# Patient Record
Sex: Male | Born: 2000 | Hispanic: Yes | Marital: Single | Smoking: Former smoker
Health system: Southern US, Community
[De-identification: ages and names within clinical notes are randomized; demographics above are authoritative.]

---
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
## 2019-06-21 ENCOUNTER — Other Ambulatory Visit: Payer: Self-pay

## 2019-06-21 ENCOUNTER — Emergency Department: Payer: BLUE CROSS/BLUE SHIELD

## 2019-06-21 ENCOUNTER — Emergency Department
Admission: EM | Admit: 2019-06-21 | Discharge: 2019-06-21 | Disposition: A | Discharge status: Home / Self Care | Payer: BLUE CROSS/BLUE SHIELD | Attending: Student | Admitting: Student

## 2019-06-21 DIAGNOSIS — M546 Pain in thoracic spine: Secondary | ICD-10-CM | POA: Insufficient documentation

## 2019-06-21 DIAGNOSIS — R079 Chest pain, unspecified: Secondary | ICD-10-CM

## 2019-06-21 DIAGNOSIS — Z87891 Personal history of nicotine dependence: Secondary | ICD-10-CM | POA: Diagnosis not present

## 2019-06-21 DIAGNOSIS — R0789 Other chest pain: Secondary | ICD-10-CM | POA: Diagnosis present

## 2019-06-21 LAB — CBC WITH DIFFERENTIAL/PLATELET
Abs Immature Granulocytes: 0.02 10*3/uL (ref 0.00–0.07)
Basophils Absolute: 0.1 10*3/uL (ref 0.0–0.1)
Basophils Relative: 1 %
Eosinophils Absolute: 0.1 10*3/uL (ref 0.0–0.5)
Eosinophils Relative: 2 %
HCT: 44.4 % (ref 39.0–52.0)
Hemoglobin: 14.5 g/dL (ref 13.0–17.0)
Immature Granulocytes: 0 %
Lymphocytes Relative: 32 %
Lymphs Abs: 2 10*3/uL (ref 0.7–4.0)
MCH: 29.2 pg (ref 26.0–34.0)
MCHC: 32.7 g/dL (ref 30.0–36.0)
MCV: 89.5 fL (ref 80.0–100.0)
Monocytes Absolute: 0.5 10*3/uL (ref 0.1–1.0)
Monocytes Relative: 8 %
Neutro Abs: 3.5 10*3/uL (ref 1.7–7.7)
Neutrophils Relative %: 57 %
Platelets: 161 10*3/uL (ref 150–400)
RBC: 4.96 MIL/uL (ref 4.22–5.81)
RDW: 12 % (ref 11.5–15.5)
WBC: 6.2 10*3/uL (ref 4.0–10.5)
nRBC: 0 % (ref 0.0–0.2)

## 2019-06-21 LAB — URINE DRUG SCREEN, QUALITATIVE (ARMC ONLY)
Amphetamines, Ur Screen: NOT DETECTED
Barbiturates, Ur Screen: NOT DETECTED
Benzodiazepine, Ur Scrn: NOT DETECTED
Cannabinoid 50 Ng, Ur ~~LOC~~: POSITIVE — AB
Cocaine Metabolite,Ur ~~LOC~~: NOT DETECTED
MDMA (Ecstasy)Ur Screen: NOT DETECTED
Methadone Scn, Ur: NOT DETECTED
Opiate, Ur Screen: NOT DETECTED
Phencyclidine (PCP) Ur S: NOT DETECTED
Tricyclic, Ur Screen: NOT DETECTED

## 2019-06-21 LAB — BASIC METABOLIC PANEL
Anion gap: 11 (ref 5–15)
BUN: 12 mg/dL (ref 6–20)
CO2: 25 mmol/L (ref 22–32)
Calcium: 9.5 mg/dL (ref 8.9–10.3)
Chloride: 104 mmol/L (ref 98–111)
Creatinine, Ser: 0.73 mg/dL (ref 0.61–1.24)
GFR calc Af Amer: >60 mL/min (ref >60)
GFR calc non Af Amer: >60 mL/min (ref >60)
Glucose, Bld: 108 mg/dL — ABNORMAL HIGH (ref 70–99)
Potassium: 3.7 mmol/L (ref 3.5–5.1)
Sodium: 140 mmol/L (ref 135–145)

## 2019-06-21 LAB — TROPONIN I (HIGH SENSITIVITY): Troponin I (High Sensitivity): <2 ng/L (ref <18)

## 2019-06-21 MED ORDER — KETOROLAC TROMETHAMINE 15 MG/ML IJ SOLN
15.0000 mg | Freq: Once | INTRAMUSCULAR | Status: DC
  Filled 2019-06-21: qty 1

## 2019-06-21 MED ORDER — IBUPROFEN 600 MG PO TABS: 600.0000 mg | ORAL_TABLET | Freq: Four times a day (QID) | ORAL | 0 refills | Status: AC | PRN

## 2019-06-21 MED ORDER — ACETAMINOPHEN 500 MG PO TABS
1000.0000 mg | ORAL_TABLET | Freq: Once | ORAL | Status: AC
  Administered 2019-06-21: 10:00:00 1000 mg via ORAL
  Filled 2019-06-21: qty 2

## 2019-06-21 MED ORDER — KETOROLAC TROMETHAMINE 30 MG/ML IJ SOLN
INTRAMUSCULAR | Status: AC
  Administered 2019-06-21: 15 mg
  Filled 2019-06-21: qty 1

## 2019-06-21 NOTE — ED Triage Notes (Signed)
Pt c/o chest pain with HA since last week, went away and then came back yesterday after smoking marijuana a little better today but still having some chest pain. Pt is In NAd at present.

## 2019-06-21 NOTE — ED Provider Notes (Signed)
[facility] Emergency Department Provider Note  ____________________________________________   First MD Initiated Contact with Patient 06/21/19 [id number]     (approximate)  I have reviewed the triage vital signs and the nursing notes.  History  Chief Complaint Chest Pain    HPI Fernando Hughes is a 18 y.o. male otherwise healthy who presents to the emergency department for upper back and chest pain.  Patient states the pain is primarily in his upper back area and radiates to his chest.  He states he had these symptoms several days ago, which resolved spontaneously after a day .  Today, he seemed to notice his symptoms return, though reports on arrival to the emergency department his symptoms are already improving.  He denies any associated nausea, vomiting, diuresis, shortness of breath.  No history of DVT or PE.  He does smoke marijuana.  He denies any other drug use.  No fevers, respiratory symptoms, GI symptoms.  He does do heavy lifting at work.         Past Medical Hx History reviewed. No pertinent past medical history.  Problem List There are no active problems to display for this patient.   Past Surgical Hx History reviewed. No pertinent surgical history.  Medications Prior to Admission medications   Medication Sig Start Date End Date Taking? Authorizing Provider  ibuprofen (ADVIL) 600 MG tablet Take 1 tablet (600 mg total) by mouth every 6 (six) hours as needed for mild pain. 06/21/19   Miguel AschoffMonks,  L., MD    Allergies Patient has no known allergies.  Family Hx No family history on file.  Social Hx Social History   Tobacco Use  . Smoking status: Former Games developermoker  . Smokeless tobacco: Never Used  Substance Use Topics  . Alcohol use: Yes  . Drug use: Yes    Types: Marijuana     Review of Systems  Constitutional: Negative for fever. Negative for chills. Eyes: Negative for visual changes. ENT: Negative for sore throat. Cardiovascular: +  for chest pain. Respiratory: Negative for shortness of breath. Gastrointestinal: Negative for abdominal pain. Negative for nausea. Negative for vomiting. Genitourinary: Negative for dysuria. Musculoskeletal: Negative for leg swelling. + Upper back pain Skin: Negative for rash. Neurological: Negative for for headaches.   Physical Exam  Vital Signs: ED Triage Vitals  Enc Vitals Group     BP 06/21/19 0919 128/85     Pulse Rate 06/21/19 0919 90     Resp 06/21/19 0919 17     Temp 06/21/19 1127 98.4 F (36.9 C)     Temp Source 06/21/19 0919 Oral     SpO2 06/21/19 0919 100 %     Weight 06/21/19 0919 125 lb (56.7 kg)     Height 06/21/19 0919 5\' 10"  (1.778 m)     Head Circumference --      Peak Flow --      Pain Score 06/21/19 0919 2     Pain Loc --      Pain Edu? --      Excl. in [place]? --     Constitutional: Alert and oriented.  Eyes: Conjunctivae clear. Sclera anicteric. Head: Normocephalic. Atraumatic. Nose: No congestion. No rhinorrhea. Mouth/Throat: Mucous membranes are moist.  Neck: No stridor.   Cardiovascular: Normal rate, regular rhythm. No murmurs. Extremities well perfused. Respiratory: Normal respiratory effort.  Lungs CTAB. Gastrointestinal: Soft and non-tender. No distention.  Musculoskeletal: No lower extremity edema.  Mild upper thoracic paraspinal muscle tender to palpation.  No  midline tenderness. Neurologic:  Normal speech and language. No gross focal neurologic deficits are appreciated.  Skin: Skin is warm, dry and intact. No rash noted. Psychiatric: Mood and affect are appropriate for situation.  EKG  Personally reviewed.   Rate: 80s Rhythm: Sinus Axis: Normal Intervals: Within normal limits Sinus rhythm with sinus arrhythmia No acute ischemic changes No STEMI    Radiology  XR: IMPRESSION: No acute abnormality of the lungs.   Procedures  Procedure(s) performed (including critical care):  Procedures   Initial Impression / Assessment  and Plan / ED Course  18 y.o. male who presents to the ED for upper back pain, chest pain, as above  Ddx: musculoskeletal, less likely ACS given age and lack of risk factors.  No risk factors, and no tachycardia, tachypnea, or hypoxia suggestive of PE.  No recent illnesses or EKG findings suggestive of pericarditis.  Plan: labs, x-ray, symptom control and reassess  EKG reveals sinus rhythm with sinus arrhythmia, but no other evidence of ischemia.  High-sensitivity troponin negative, x-ray negative, and remainder of labs without actionable derangements.  Suspect likely musculoskeletal etiology of patient's symptoms.  We will plan for discharge with supportive care, advised PCP follow-up and given return precautions.  Patient is agreeable with the plan and discharge.    Final Clinical Impression(s) / ED Diagnosis  Final diagnoses:  Chest pain in adult       Note:  This document was prepared using Dragon voice recognition software and may include unintentional dictation errors.   Lilia Pro., MD 06/21/19 [id number]

## 2019-06-21 NOTE — Discharge Instructions (Addendum)
Thank you for letting us take care of you in the emergency department today.   Please continue to take any regular, prescribed medications.   New medications we have prescribed:  - ibuprofen, for pain as needed  Please follow up with: - Your primary care doctor to review your ER visit and follow up on your symptoms.   Please return to the ER for any new or worsening symptoms.

## 2020-09-24 IMAGING — CR DG CHEST 2V
1 series · 2 of 2 positions shown · non-contrast
Comparison: None.

CLINICAL DATA: Chest pain, headache

EXAM:
CHEST - 2 VIEW

[Series 1: w chest pa · 0.14mm/px · 2 of 2 slices shown]
[im 1/2]
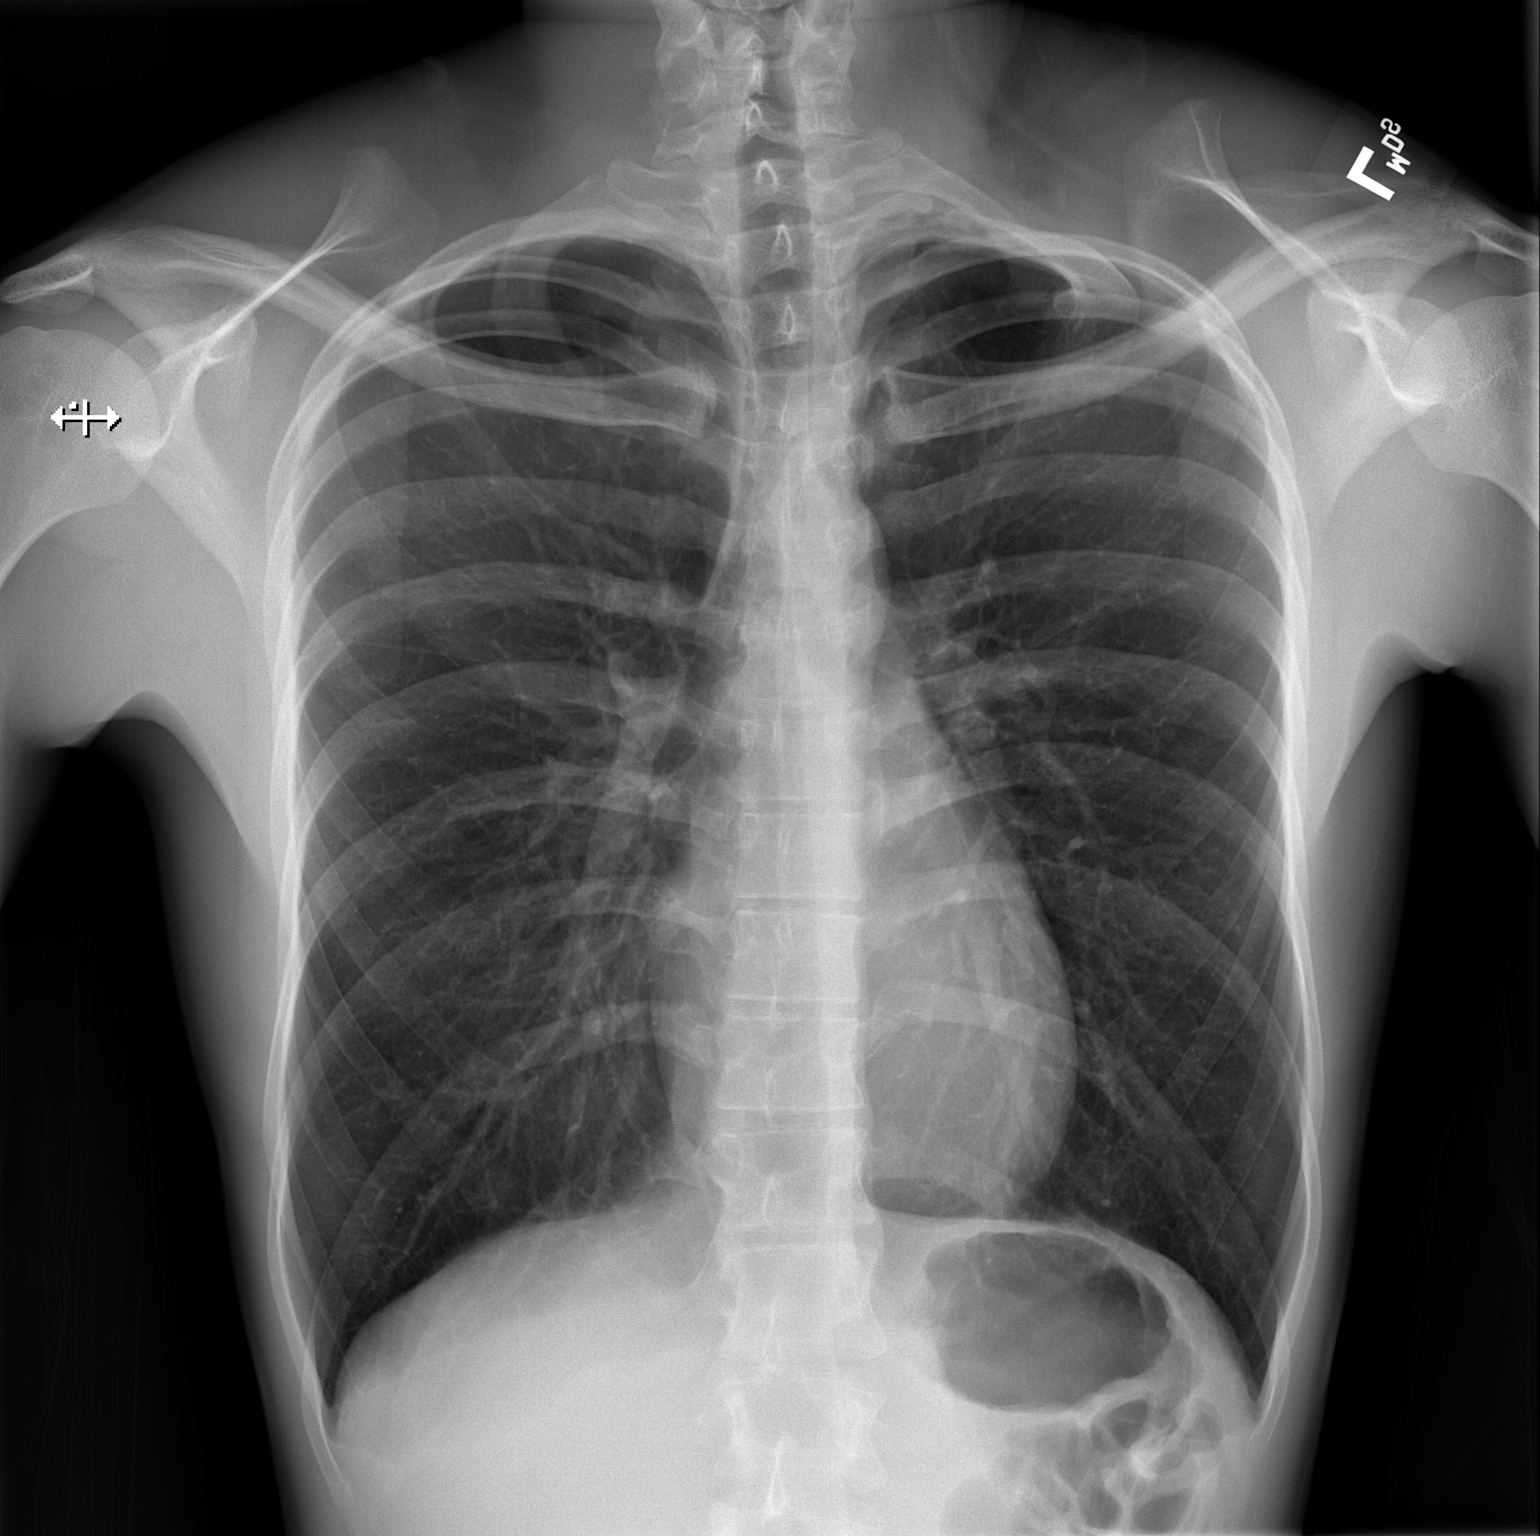
[im 2/2]
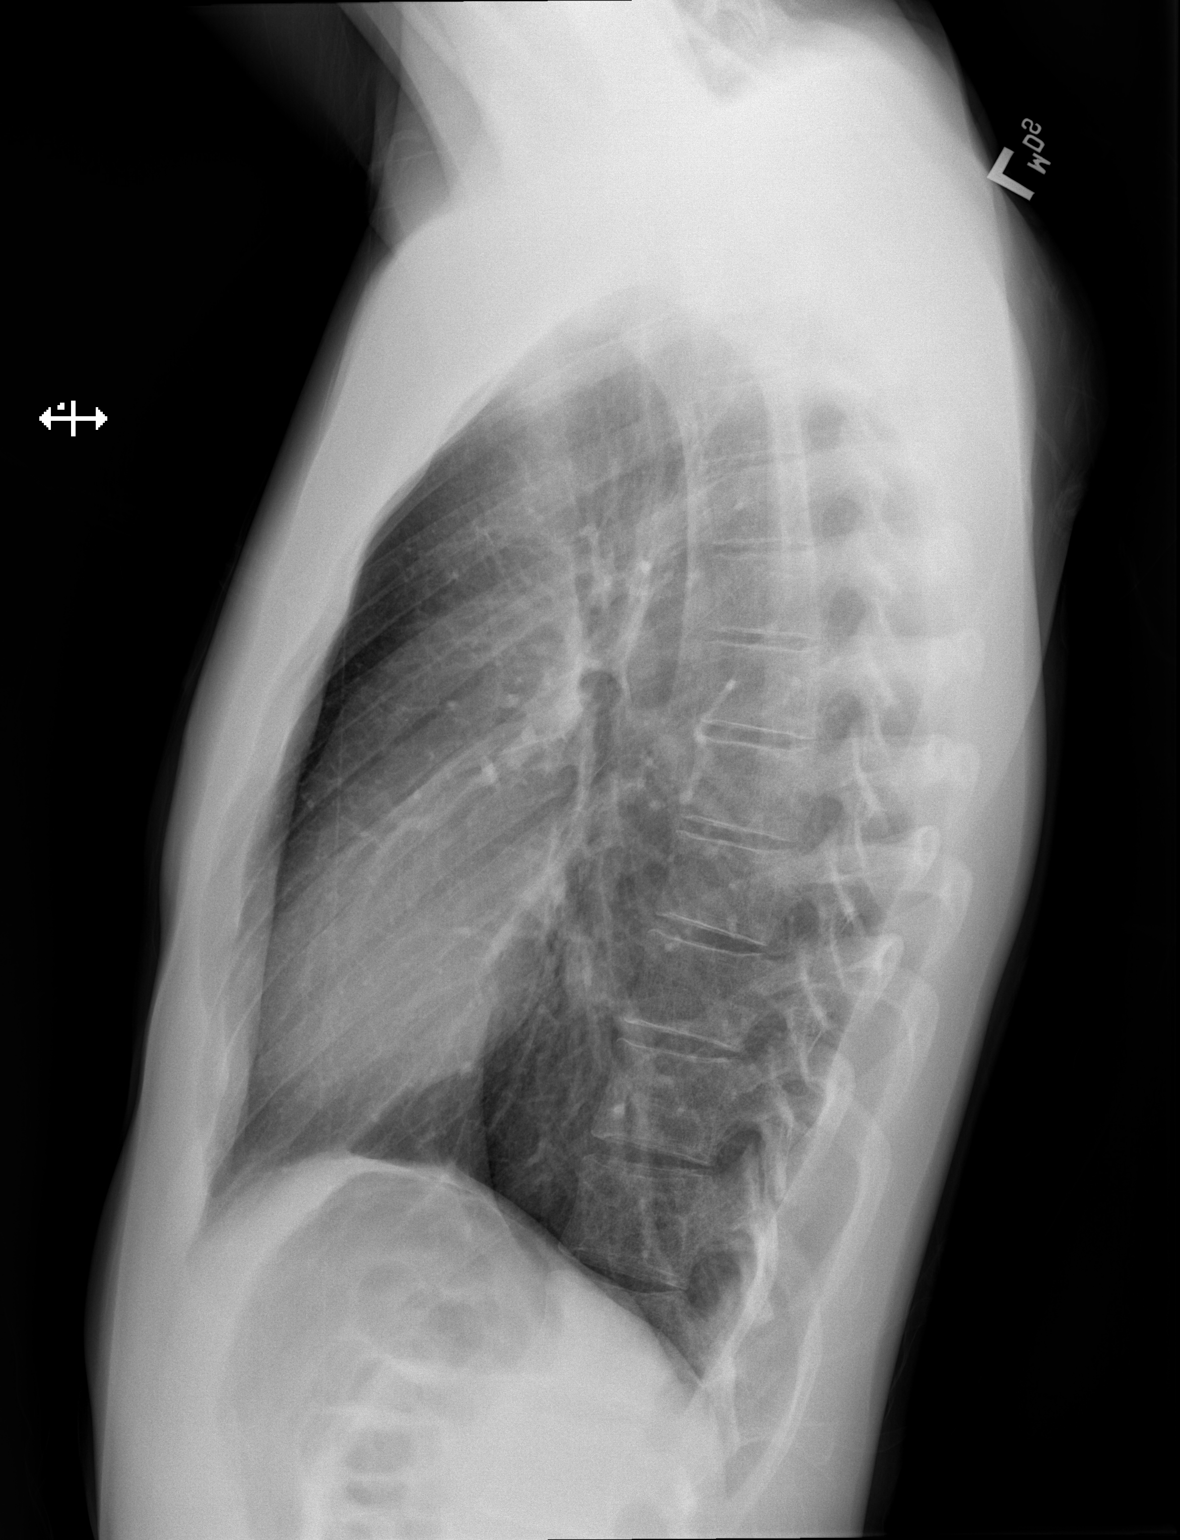

[2 of 2 positions shown; findings below may reference images not displayed]

FINDINGS: The heart size and mediastinal contours are within normal limits.
Both lungs are clear. The visualized skeletal structures are
unremarkable.
IMPRESSION: No acute abnormality of the lungs.

## 2022-12-13 ENCOUNTER — Other Ambulatory Visit: Payer: Self-pay

## 2022-12-13 ENCOUNTER — Emergency Department
Admission: EM | Admit: 2022-12-13 | Discharge: 2022-12-13 | Disposition: A | Discharge status: Home / Self Care | Payer: BC Managed Care – PPO | Attending: Emergency Medicine | Admitting: Emergency Medicine

## 2022-12-13 DIAGNOSIS — G43819 Other migraine, intractable, without status migrainosus: Secondary | ICD-10-CM | POA: Diagnosis not present

## 2022-12-13 DIAGNOSIS — R519 Headache, unspecified: Secondary | ICD-10-CM | POA: Diagnosis present

## 2022-12-13 MED ORDER — BUTALBITAL-APAP-CAFFEINE 50-325-40 MG PO TABS: 1.0000 | ORAL_TABLET | Freq: Four times a day (QID) | ORAL | 0 refills | Status: AC | PRN

## 2022-12-13 MED ORDER — PROMETHAZINE HCL 25 MG/ML IJ SOLN
25.0000 mg | Freq: Four times a day (QID) | INTRAMUSCULAR | Status: DC | PRN
  Administered 2022-12-13: 25 mg via INTRAMUSCULAR
  Filled 2022-12-13 (×2): qty 1

## 2022-12-13 MED ORDER — DIPHENHYDRAMINE HCL 25 MG PO CAPS
50.0000 mg | ORAL_CAPSULE | Freq: Once | ORAL | Status: AC
  Administered 2022-12-13: 50 mg via ORAL
  Filled 2022-12-13: qty 2

## 2022-12-13 MED ORDER — KETOROLAC TROMETHAMINE 30 MG/ML IJ SOLN
30.0000 mg | Freq: Once | INTRAMUSCULAR | Status: AC
  Administered 2022-12-13: 30 mg via INTRAMUSCULAR
  Filled 2022-12-13: qty 1

## 2022-12-13 MED ORDER — SUMATRIPTAN SUCCINATE 6 MG/0.5ML ~~LOC~~ SOLN
6.0000 mg | Freq: Once | SUBCUTANEOUS | Status: AC
  Administered 2022-12-13: 6 mg via SUBCUTANEOUS
  Filled 2022-12-13: qty 0.5

## 2022-12-13 NOTE — ED Triage Notes (Signed)
Pt to ED from home for headache x 2 days. Pt also has some mid thoracic back pain as well. Pt feels like this is like a previous HX of migraines. Pt has been taking BC powder. Pt is CAOx4 and in no acute distress and ambulatory in triage.

## 2022-12-13 NOTE — ED Provider Notes (Signed)
[facility] Provider Note  Patient Contact: 10:43 PM (approximate)   History   Headache (X 2 days)   HPI  Fernando Hughes is a 22 y.o. male who presents the emergency department with his father for complaint of migraine headache.  Patient has a long multiyear history of recurrent migraines.  Patient states that he was at work, was on a high-rise lift when it jostled him.  Patient has had some tightness in his shoulders which he feels like has contributed/triggered his migraine.  Patient has no atypical features of his migraine.  He does have some photosensitivity but no photosensitivity.  Patient denies any blurred vision, unilateral weakness, slurred speech.  Patient states that he typically takes combination of Tylenol or Motrin or BC powder.  He states that the headache is persisting despite his use of BC powder and presents for medication.  Again no atypical features.  This was not a thunderclap headache.  This is not the worst headache of his life.     Physical Exam   Triage Vital Signs: ED Triage Vitals  Enc Vitals Group     BP 12/13/22 2123 (!) 134/100     Pulse Rate 12/13/22 2123 69     Resp 12/13/22 2123 16     Temp 12/13/22 2123 98.7 F (37.1 C)     Temp Source 12/13/22 2123 Oral     SpO2 12/13/22 2123 98 %     Weight 12/13/22 2124 130 lb (59 kg)     Height 12/13/22 2124 '5\' 9"'$  (1.753 m)     Head Circumference --      Peak Flow --      Pain Score 12/13/22 2124 10     Pain Loc --      Pain Edu? --      Excl. in [place]? --     Most recent vital signs: Vitals:   12/13/22 2123 12/13/22 2310  BP: (!) 134/100 116/78  Pulse: 69 (!) 51  Resp: 16 17  Temp: 98.7 F (37.1 C)   SpO2: 98% 100%     General: Alert and in no acute distress. Eyes:  PERRL. EOMI. Head: No acute traumatic findings  Neck: No stridor. No cervical spine tenderness to palpation.  Full range of motion of the cervical spine.  Cardiovascular:  Good peripheral  perfusion Respiratory: Normal respiratory effort without tachypnea or retractions. Lungs CTAB.  Musculoskeletal: Full range of motion to all extremities.  Palpation elicits tenderness in the left thoracic paraspinal muscle extending up into the left trapezius muscle.  No midline tenderness. Neurologic:  No gross focal neurologic deficits are appreciated.  Cranial nerves II through XII grossly intact.  Negative Romberg's and pronator drift. Skin:   No rash noted Other:   ED Results / Procedures / Treatments   Labs (all labs ordered are listed, but only abnormal results are displayed) Labs Reviewed - No data to display   EKG     RADIOLOGY    No results found.  PROCEDURES:  Critical Care performed: No  Procedures   MEDICATIONS ORDERED IN ED: Medications  promethazine (PHENERGAN) injection 25 mg (has no administration in time range)  ketorolac (TORADOL) 30 MG/ML injection 30 mg (30 mg Intramuscular Given 12/13/22 2311)  SUMAtriptan (IMITREX) injection 6 mg (6 mg Subcutaneous Given 12/13/22 2311)  diphenhydrAMINE (BENADRYL) capsule 50 mg (50 mg Oral Given 12/13/22 2312)     IMPRESSION / MDM / ASSESSMENT AND PLAN / ED COURSE  I reviewed  the triage vital signs and the nursing notes.                                 Differential diagnosis includes, but is not limited to, migraine, tension type headache, intracranial hemorrhage, intracranial mass, meningitis  Patient's presentation is most consistent with acute presentation with potential threat to life or bodily function.   Patient's diagnosis is consistent with migraine headache.  Patient presents emergency department with migraine headache.  No atypical features of the migraine.  Patient states that his normal over-the-counter medication regimen did not alleviate his headache.  Patient states that he was on a high lift, looking above himself working on an object as well as having a couple of jostle's.  He feels that the muscle  tightness triggered a migraine.  No concerning neurosymptoms.  Patient has a long multiyear history of migraines.  He does not have a neurologist.  At this time I do not feel that imaging is warranted.  No concern for infectious or traumatic process.  At this time patient will be given migraine cocktail.  Concerning signs and symptoms and return precautions discussed with the patient.  Patient should follow-up with neurology as he has never seen a neurologist for his ongoing and recurrent migraines.  Migraine cocktail again given tonight.  Will prescribe Fioricet for intractable migraines going forward..  Patient is given ED precautions to return to the ED for any worsening or new symptoms.     FINAL CLINICAL IMPRESSION(S) / ED DIAGNOSES   Final diagnoses:  Other migraine without status migrainosus, intractable     Rx / DC Orders   ED Discharge Orders     None        Note:  This document was prepared using Dragon voice recognition software and may include unintentional dictation errors.   Brynda Peon 12/13/22 2320    Lucillie Garfinkel, MD 12/14/22 2308

## 2023-01-22 ENCOUNTER — Other Ambulatory Visit: Payer: Self-pay

## 2023-01-22 ENCOUNTER — Emergency Department
Admission: EM | Admit: 2023-01-22 | Discharge: 2023-01-22 | Disposition: A | Discharge status: Home / Self Care | Payer: BC Managed Care – PPO | Attending: Emergency Medicine | Admitting: Emergency Medicine

## 2023-01-22 DIAGNOSIS — R1110 Vomiting, unspecified: Secondary | ICD-10-CM | POA: Diagnosis present

## 2023-01-22 DIAGNOSIS — F1210 Cannabis abuse, uncomplicated: Secondary | ICD-10-CM | POA: Diagnosis not present

## 2023-01-22 LAB — CBC
HCT: 46.3 % (ref 39.0–52.0)
Hemoglobin: 15.1 g/dL (ref 13.0–17.0)
MCH: 29.4 pg (ref 26.0–34.0)
MCHC: 32.6 g/dL (ref 30.0–36.0)
MCV: 90.3 fL (ref 80.0–100.0)
Platelets: 226 10*3/uL (ref 150–400)
RBC: 5.13 MIL/uL (ref 4.22–5.81)
RDW: 11.9 % (ref 11.5–15.5)
WBC: 21.8 10*3/uL — ABNORMAL HIGH (ref 4.0–10.5)
nRBC: 0 % (ref 0.0–0.2)

## 2023-01-22 LAB — URINE DRUG SCREEN, QUALITATIVE (ARMC ONLY)
Amphetamines, Ur Screen: NOT DETECTED
Barbiturates, Ur Screen: NOT DETECTED
Benzodiazepine, Ur Scrn: NOT DETECTED
Cannabinoid 50 Ng, Ur ~~LOC~~: POSITIVE — AB
Cocaine Metabolite,Ur ~~LOC~~: NOT DETECTED
MDMA (Ecstasy)Ur Screen: NOT DETECTED
Methadone Scn, Ur: NOT DETECTED
Opiate, Ur Screen: NOT DETECTED
Phencyclidine (PCP) Ur S: NOT DETECTED
Tricyclic, Ur Screen: POSITIVE — AB

## 2023-01-22 LAB — COMPREHENSIVE METABOLIC PANEL
ALT: 28 U/L (ref 0–44)
AST: 43 U/L — ABNORMAL HIGH (ref 15–41)
Albumin: 4.6 g/dL (ref 3.5–5.0)
Alkaline Phosphatase: 74 U/L (ref 38–126)
Anion gap: 22 — ABNORMAL HIGH (ref 5–15)
BUN: 14 mg/dL (ref 6–20)
CO2: 14 mmol/L — ABNORMAL LOW (ref 22–32)
Calcium: 9.2 mg/dL (ref 8.9–10.3)
Chloride: 104 mmol/L (ref 98–111)
Creatinine, Ser: 0.82 mg/dL (ref 0.61–1.24)
GFR, Estimated: >60 mL/min (ref >60)
Glucose, Bld: 74 mg/dL (ref 70–99)
Potassium: 3.6 mmol/L (ref 3.5–5.1)
Sodium: 140 mmol/L (ref 135–145)
Total Bilirubin: 1.2 mg/dL (ref 0.3–1.2)
Total Protein: 7.5 g/dL (ref 6.5–8.1)

## 2023-01-22 LAB — ETHANOL: Alcohol, Ethyl (B): 59 mg/dL — ABNORMAL HIGH (ref <10)

## 2023-01-22 MED ORDER — SODIUM CHLORIDE 0.9 % IV BOLUS
1000.0000 mL | Freq: Once | INTRAVENOUS | Status: AC
  Administered 2023-01-22: 1000 mL via INTRAVENOUS

## 2023-01-22 MED ORDER — ONDANSETRON HCL 4 MG/2ML IJ SOLN
INTRAMUSCULAR | Status: AC
  Administered 2023-01-22: 4 mg via INTRAVENOUS
  Filled 2023-01-22: qty 2

## 2023-01-22 MED ORDER — SODIUM CHLORIDE 0.9 % IV SOLN
25.0000 mg | Freq: Once | INTRAVENOUS | Status: AC
  Administered 2023-01-22: 25 mg via INTRAVENOUS
  Filled 2023-01-22: qty 1

## 2023-01-22 MED ORDER — ONDANSETRON 4 MG PO TBDP: 4.0000 mg | ORAL_TABLET | Freq: Three times a day (TID) | ORAL | 0 refills | Status: AC | PRN

## 2023-01-22 MED ORDER — ONDANSETRON HCL 4 MG/2ML IJ SOLN: 4.0000 mg | Freq: Once | INTRAMUSCULAR | Status: AC

## 2023-01-22 NOTE — Discharge Instructions (Addendum)
Clear liquids only today then start with bland foods tomorrow.

## 2023-01-22 NOTE — ED Provider Notes (Signed)
[facility] Provider Note    Event Date/Time   First MD Initiated Contact with Patient 01/22/23 0827     (approximate)   History   Alcohol Problem   HPI  Fernando Hughes is a 22 y.o. male with no significant PMH and as listed in EMR presents to the emergency department for treatment and evaluation after drinking tequila last night. He doesn't usually drink in excess and has been vomiting since last night. No abdominal pain.    Physical Exam   Triage Vital Signs: ED Triage Vitals  Enc Vitals Group     BP 01/22/23 0832 126/64     Pulse Rate 01/22/23 0832 70     Resp 01/22/23 0832 16     Temp 01/22/23 0832 (!) 97.5 F (36.4 C)     Temp Source 01/22/23 0832 Oral     SpO2 01/22/23 0832 100 %     Weight 01/22/23 0824 130 lb 1.1 oz (59 kg)     Height 01/22/23 0824  (1.753 m)     Head Circumference --      Peak Flow --      Pain Score 01/22/23 0824 0     Pain Loc --      Pain Edu? --      Excl. in GC? --     Most recent vital signs: Vitals:   01/22/23 0832  BP: 126/64  Pulse: 70  Resp: 16  Temp: (!) 97.5 F (36.4 C)  SpO2: 100%    General: Awake, no distress.  CV:  Good peripheral perfusion.  Resp:  Normal effort.  Abd:  No distention. Nontender Other:     ED Results / Procedures / Treatments   Labs (all labs ordered are listed, but only abnormal results are displayed) Labs Reviewed  ETHANOL - Abnormal; Notable for the following components:      Result Value   Alcohol, Ethyl (B) 59 (*)    All other components within normal limits  CBC - Abnormal; Notable for the following components:   WBC 21.8 (*)    All other components within normal limits  URINE DRUG SCREEN, QUALITATIVE ([facility] ONLY) - Abnormal; Notable for the following components:   Tricyclic, Ur Screen POSITIVE (*)    Cannabinoid 50 Ng, Ur [place] POSITIVE (*)    All other components within normal limits  COMPREHENSIVE METABOLIC PANEL - Abnormal; Notable for the following  components:   CO2 14 (*)    AST 43 (*)    Anion gap 22 (*)    All other components within normal limits     EKG  Not indicated.   RADIOLOGY   PROCEDURES:  Critical Care performed: No  Procedures   MEDICATIONS ORDERED IN ED:  Medications  ondansetron (ZOFRAN) injection 4 mg (4 mg Intravenous Given 01/22/23 0831)  sodium chloride 0.9 % bolus 1,000 mL (0 mLs Intravenous Stopped 01/22/23 0935)  sodium chloride 0.9 % bolus 1,000 mL (1,000 mLs Intravenous New Bag/Given 01/22/23 0935)  promethazine (PHENERGAN) 25 mg in sodium chloride 0.9 % 50 mL IVPB (25 mg Intravenous New Bag/Given 01/22/23 1012)     IMPRESSION / MDM / ASSESSMENT AND PLAN / ED COURSE   I have reviewed the triage note.  Differential diagnosis includes, but is not limited to, hyperemesis due to alcohol or cannabis, gastritis  Patient's presentation is most consistent with acute complicated illness / injury requiring diagnostic workup.  22 year old male presenting to the emergency department for treatment  and evaluation after drinking tequila in excess last night. Actively vomiting on arrival to room. IV fluids and zofran ordered.  Continues to vomit after zofran. Phenergan ordered.   Labs show white count of 21.8, EtOH level of 59, and an essentially normal CMP with the exception of the anion gap.  He is receiving IV fluids. Urinalysis is positive for tricyclic's and cannabinoids.   Patient feeling better after Phenergan. No further vomiting. He feels ready to go home.      FINAL CLINICAL IMPRESSION(S) / ED DIAGNOSES   Final diagnoses:  Hyperemesis  Cannabis abuse     Rx / DC Orders   ED Discharge Orders          Ordered    ondansetron (ZOFRAN-ODT) 4 MG disintegrating tablet  Every 8 hours PRN        01/22/23 1055             Note:  This document was prepared using Dragon voice recognition software and may include unintentional dictation errors.   Chinita Pester, FNP 01/22/23  1100    Sharyn Creamer, MD 01/23/23 1850

## 2023-01-22 NOTE — ED Notes (Signed)
Pt assisted to the bathroom.

## 2023-01-22 NOTE — ED Triage Notes (Signed)
Pt here with a etoh hangover. Pt states he was drinking 80% proof tequila since last night and woke up vomiting and not feeling well. Pt states he normally does not drink like that. Pt alert and oriented and answering questions approprietly.
